# Patient Record
Sex: Female | Born: 1950 | Race: White | Hispanic: No | Marital: Married | Smoking: Never smoker
Health system: Southern US, Community
[De-identification: ages and names within clinical notes are randomized; demographics above are authoritative.]

## PROBLEM LIST (undated history)

## (undated) DIAGNOSIS — R011 Cardiac murmur, unspecified: Secondary | ICD-10-CM

## (undated) DIAGNOSIS — R1310 Dysphagia, unspecified: Secondary | ICD-10-CM

## (undated) DIAGNOSIS — C801 Malignant (primary) neoplasm, unspecified: Secondary | ICD-10-CM

## (undated) DIAGNOSIS — L719 Rosacea, unspecified: Secondary | ICD-10-CM

## (undated) DIAGNOSIS — E119 Type 2 diabetes mellitus without complications: Secondary | ICD-10-CM

## (undated) HISTORY — PX: OTHER SURGICAL HISTORY: SHX169

## (undated) HISTORY — PX: ABDOMINAL HYSTERECTOMY: SHX81

## (undated) HISTORY — PX: NEPHRECTOMY: SHX65

## (undated) HISTORY — PX: LYMPHADENECTOMY: SHX15

---

## 2004-12-17 ENCOUNTER — Ambulatory Visit: Payer: Self-pay | Admitting: Internal Medicine

## 2007-09-08 ENCOUNTER — Ambulatory Visit: Payer: Self-pay | Admitting: Internal Medicine

## 2009-09-21 ENCOUNTER — Ambulatory Visit: Payer: Self-pay | Admitting: Internal Medicine

## 2010-11-20 ENCOUNTER — Ambulatory Visit: Payer: Self-pay | Admitting: Internal Medicine

## 2011-12-19 ENCOUNTER — Ambulatory Visit: Payer: Self-pay | Admitting: Internal Medicine

## 2012-05-01 ENCOUNTER — Ambulatory Visit: Payer: Self-pay | Admitting: Gastroenterology

## 2012-05-04 LAB — PATHOLOGY REPORT

## 2013-02-23 ENCOUNTER — Ambulatory Visit: Payer: Self-pay | Admitting: Internal Medicine

## 2013-06-10 DIAGNOSIS — C801 Malignant (primary) neoplasm, unspecified: Secondary | ICD-10-CM

## 2013-06-10 HISTORY — DX: Malignant (primary) neoplasm, unspecified: C80.1

## 2013-11-10 ENCOUNTER — Inpatient Hospital Stay: Payer: Self-pay | Admitting: Internal Medicine

## 2013-11-10 LAB — URINALYSIS, COMPLETE
BLOOD: NEGATIVE
Bilirubin,UR: NEGATIVE
Glucose,UR: NEGATIVE mg/dL (ref 0–75)
Leukocyte Esterase: NEGATIVE
NITRITE: NEGATIVE
Ph: 8 (ref 4.5–8.0)
Protein: 30
RBC,UR: NONE SEEN /HPF (ref 0–5)
Specific Gravity: 1.024 (ref 1.003–1.030)
WBC UR: NONE SEEN /HPF (ref 0–5)

## 2013-11-10 LAB — COMPREHENSIVE METABOLIC PANEL
ALBUMIN: 4.3 g/dL (ref 3.4–5.0)
Alkaline Phosphatase: 74 U/L
Anion Gap: 6 — ABNORMAL LOW (ref 7–16)
BILIRUBIN TOTAL: 0.5 mg/dL (ref 0.2–1.0)
BUN: 25 mg/dL — ABNORMAL HIGH (ref 7–18)
CREATININE: 0.78 mg/dL (ref 0.60–1.30)
Calcium, Total: 10 mg/dL (ref 8.5–10.1)
Chloride: 103 mmol/L (ref 98–107)
Co2: 26 mmol/L (ref 21–32)
EGFR (African American): >60
GLUCOSE: 154 mg/dL — AB (ref 65–99)
OSMOLALITY: 278 (ref 275–301)
Potassium: 4.3 mmol/L (ref 3.5–5.1)
SGOT(AST): 38 U/L — ABNORMAL HIGH (ref 15–37)
SGPT (ALT): 25 U/L (ref 12–78)
Sodium: 135 mmol/L — ABNORMAL LOW (ref 136–145)
Total Protein: 8.4 g/dL — ABNORMAL HIGH (ref 6.4–8.2)

## 2013-11-10 LAB — CBC WITH DIFFERENTIAL/PLATELET
BASOS ABS: 0.1 10*3/uL (ref 0.0–0.1)
Basophil %: 0.3 %
Eosinophil #: 0 10*3/uL (ref 0.0–0.7)
Eosinophil %: 0.2 %
HCT: 45 % (ref 35.0–47.0)
HGB: 15.1 g/dL (ref 12.0–16.0)
LYMPHS ABS: 1.5 10*3/uL (ref 1.0–3.6)
LYMPHS PCT: 8.3 %
MCH: 29.9 pg (ref 26.0–34.0)
MCHC: 33.6 g/dL (ref 32.0–36.0)
MCV: 89 fL (ref 80–100)
Monocyte #: 1.1 x10 3/mm — ABNORMAL HIGH (ref 0.2–0.9)
Monocyte %: 5.9 %
NEUTROS ABS: 15.5 10*3/uL — AB (ref 1.4–6.5)
Neutrophil %: 85.3 %
Platelet: 452 10*3/uL — ABNORMAL HIGH (ref 150–440)
RBC: 5.06 10*6/uL (ref 3.80–5.20)
RDW: 14.1 % (ref 11.5–14.5)
WBC: 18.1 10*3/uL — AB (ref 3.6–11.0)

## 2013-11-10 LAB — TROPONIN I: Troponin-I: <0.02 ng/mL

## 2013-11-10 LAB — LIPASE, BLOOD: LIPASE: 79 U/L (ref 73–393)

## 2013-11-11 LAB — CBC WITH DIFFERENTIAL/PLATELET
BASOS PCT: 0.4 %
Basophil #: 0 10*3/uL (ref 0.0–0.1)
EOS PCT: 0.7 %
Eosinophil #: 0.1 10*3/uL (ref 0.0–0.7)
HCT: 40.3 % (ref 35.0–47.0)
HGB: 13.5 g/dL (ref 12.0–16.0)
Lymphocyte #: 1.6 10*3/uL (ref 1.0–3.6)
Lymphocyte %: 18 %
MCH: 29.9 pg (ref 26.0–34.0)
MCHC: 33.6 g/dL (ref 32.0–36.0)
MCV: 89 fL (ref 80–100)
MONOS PCT: 12.1 %
Monocyte #: 1.1 x10 3/mm — ABNORMAL HIGH (ref 0.2–0.9)
NEUTROS ABS: 6 10*3/uL (ref 1.4–6.5)
Neutrophil %: 68.8 %
Platelet: 389 10*3/uL (ref 150–440)
RBC: 4.52 10*6/uL (ref 3.80–5.20)
RDW: 14 % (ref 11.5–14.5)
WBC: 8.7 10*3/uL (ref 3.6–11.0)

## 2013-11-11 LAB — COMPREHENSIVE METABOLIC PANEL
ALBUMIN: 3.3 g/dL — AB (ref 3.4–5.0)
ALK PHOS: 60 U/L
Anion Gap: 4 — ABNORMAL LOW (ref 7–16)
BILIRUBIN TOTAL: 0.6 mg/dL (ref 0.2–1.0)
BUN: 24 mg/dL — AB (ref 7–18)
CHLORIDE: 105 mmol/L (ref 98–107)
CREATININE: 0.73 mg/dL (ref 0.60–1.30)
Calcium, Total: 8.4 mg/dL — ABNORMAL LOW (ref 8.5–10.1)
Co2: 28 mmol/L (ref 21–32)
Glucose: 119 mg/dL — ABNORMAL HIGH (ref 65–99)
OSMOLALITY: 279 (ref 275–301)
POTASSIUM: 4.3 mmol/L (ref 3.5–5.1)
SGOT(AST): 26 U/L (ref 15–37)
SGPT (ALT): 18 U/L (ref 12–78)
Sodium: 137 mmol/L (ref 136–145)
Total Protein: 6.6 g/dL (ref 6.4–8.2)

## 2014-04-06 ENCOUNTER — Ambulatory Visit: Payer: Self-pay | Admitting: Internal Medicine

## 2014-10-01 NOTE — Discharge Summary (Signed)
PATIENT NAME:  Sandra Gordon, Sandra Gordon MR#:  [id number] DATE OF BIRTH:  [birth year]  DATE OF ADMISSION:  11/10/2013 DATE OF DISCHARGE:  11/13/2013  DISCHARGE DIAGNOSES:   1. Small bowel obstruction/ileus.  2. Right adnexal/ovarian cystic mass, etiology unknown.  Found on CT.  3. 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  4. Right kidney mass.  5. Bladder issues.  6. Arthritis.   DISCHARGE MEDICATIONS: As follows: Tums Ultra 1000  p.o. b.i.d. with meals, citalopram 20 mg p.o. daily, meloxicam 15 mg p.o. daily, spironolactone 50 mg p.o. b.i.d., temazepam 15 p.o. at bedtime, tolterodine 2 mg b.i.d., doxycycline 100 p.o. daily.   INDICATION FOR ADMISSION:  The patient is a pleasant 64 year old female who presented with epigastric pain and nausea. CT scan showed among other findings, abdominal fullness and epigastric pain.  She had a CT scan, which was concerning for ileus or small bowel obstruction. She was admitted for management of possible small bowel obstruction.   HOSPITAL COURSE: As follows:  This patient was admitted for an acute epigastric pain and CT scan concerning for a small bowel obstruction; however, had no history of surgeries but with the acuity was not concerned of the obstructing mass. Potentially she did have some sort of other  issue which could be causing a blockage and, therefore, we did decide to treat her with an NG decompression.  Throughout hospital course, her x-ray looked better.  Her contrast which she had drunk had progressed. She began having bowel movements and passing flatus and NG output became minimal. NG tube was taken out and then she was converted from clear liquids to a regular diet. At the time of discharge was having no pain and was tolerating a diet with bowel movements.   DISCHARGE INSTRUCTIONS: This patient is to follow up with me in approximately 1 week. I will set her up with Dr. Elnoria Howard of nephrology as well as her OB/GYN to evaluate the other incidental findings on her CT  scan. There are no acute surgical issues.     ____________________________ Glena Norfolk Luster Hechler, MD cal:dd/am D: 11/25/2013 19:25:38 ET T: 11/26/2013 04:12:55 ET JOB#: [id number]  cc: Harrell Gave A. Oaklyn Jakubek, MD, <Dictator> Floyde Parkins MD ELECTRONICALLY SIGNED 11/30/2013 10:47

## 2014-10-01 NOTE — H&P (Signed)
   Subjective/Chief Complaint Epigastric pain, anorexia, feels full   History of Present Illness Sandra Gordon is a relatively healthy 64 yo F who presents with 1 day of worsening, constant epigastric pain.  Began early this am.  Took ibuprofen initially but did not get better.  Initially intermittent, now constant.  + chills with pain.  no n/v but was not able to drink more than 1 bottle of contrast because feeling full.  Last BM today, somewhat loose.  No sick contacts, no unusual ingestions, no foreign travel.   Past History Bladder issues Arthritis   Past Medical Health None   Past Med/Surgical Hx:  Denies medical history:   ALLERGIES:  Codeine: Unknown  Sulfa: Unknown  Family and Social History:  Family History Coronary Artery Disease  Hypertension  Diabetes Mellitus   Social History negative tobacco, negative ETOH, negative Illicit drugs   Place of Living Home  Here with husband   Review of Systems:  Subjective/Chief Complaint Epigastric pain, constant, anorexia, feeling full   Fever/Chills Yes   Cough No   Sputum No   Abdominal Pain Yes   Diarrhea No   Constipation No   Nausea/Vomiting No   SOB/DOE No   Chest Pain No   Dysuria No   Tolerating PT No   Tolerating Diet No   Physical Exam:  GEN well developed, well nourished, no acute distress   HEENT pink conjunctivae, PERRL, hearing intact to voice, good dentition   RESP normal resp effort  clear BS  no use of accessory muscles   CARD regular rate  no murmur   ABD denies tenderness  no hernia  soft  distended  normal BS   EXTR negative cyanosis/clubbing, negative edema   SKIN normal to palpation, No rashes, No ulcers, skin turgor good   NEURO cranial nerves intact, negative rigidity, negative tremor, follows commands, strength:, motor/sensory function intact   PSYCH alert, A+O to time, place, person, good insight    Assessment/Admission Diagnosis Sandra Gordon is a pleasant 64 yo F who presents  with 1 day of epigastric pain, leukocytosis and CT findings consistent with ? SBO.  No history of abdominal surgery.  LFT/Lipase NL.   Plan Admit for IVF, NPO, NG tube.  Am KUB to look for contrast progression.  Serial abd exams.   Electronic Signatures: Floyde Parkins (MD)  (Signed 03-Jun-15 16:03)  Authored: CHIEF COMPLAINT and HISTORY, PAST MEDICAL/SURGIAL HISTORY, ALLERGIES, FAMILY AND SOCIAL HISTORY, REVIEW OF SYSTEMS, PHYSICAL EXAM, ASSESSMENT AND PLAN   Last Updated: 03-Jun-15 16:03 by Floyde Parkins (MD)

## 2014-10-13 ENCOUNTER — Other Ambulatory Visit: Payer: Self-pay | Admitting: Gastroenterology

## 2014-10-13 DIAGNOSIS — R1013 Epigastric pain: Secondary | ICD-10-CM

## 2014-10-13 DIAGNOSIS — R1319 Other dysphagia: Secondary | ICD-10-CM

## 2014-10-13 DIAGNOSIS — R1310 Dysphagia, unspecified: Secondary | ICD-10-CM

## 2014-10-17 ENCOUNTER — Ambulatory Visit
Admission: RE | Admit: 2014-10-17 | Discharge: 2014-10-17 | Disposition: A | Discharge status: Home / Self Care | Payer: BLUE CROSS/BLUE SHIELD | Source: Ambulatory Visit | Attending: Gastroenterology | Admitting: Gastroenterology

## 2014-10-17 DIAGNOSIS — K219 Gastro-esophageal reflux disease without esophagitis: Secondary | ICD-10-CM | POA: Diagnosis not present

## 2014-10-17 DIAGNOSIS — R1013 Epigastric pain: Secondary | ICD-10-CM

## 2014-10-17 DIAGNOSIS — R1319 Other dysphagia: Secondary | ICD-10-CM

## 2014-10-17 DIAGNOSIS — R1310 Dysphagia, unspecified: Secondary | ICD-10-CM

## 2014-10-17 HISTORY — DX: Malignant (primary) neoplasm, unspecified: C80.1

## 2014-11-10 ENCOUNTER — Encounter: Payer: Self-pay | Admitting: *Deleted

## 2014-11-11 ENCOUNTER — Ambulatory Visit: Payer: BLUE CROSS/BLUE SHIELD | Admitting: Anesthesiology

## 2014-11-11 ENCOUNTER — Ambulatory Visit
Admission: RE | Admit: 2014-11-11 | Discharge: 2014-11-11 | Disposition: A | Discharge status: Home / Self Care | Payer: BLUE CROSS/BLUE SHIELD | Source: Ambulatory Visit | Attending: Gastroenterology | Admitting: Gastroenterology

## 2014-11-11 ENCOUNTER — Encounter
Admission: RE | Disposition: A | Discharge status: Home / Self Care | Payer: Self-pay | Source: Ambulatory Visit | Attending: Gastroenterology

## 2014-11-11 DIAGNOSIS — Z885 Allergy status to narcotic agent status: Secondary | ICD-10-CM | POA: Insufficient documentation

## 2014-11-11 DIAGNOSIS — K222 Esophageal obstruction: Secondary | ICD-10-CM | POA: Insufficient documentation

## 2014-11-11 DIAGNOSIS — R1013 Epigastric pain: Secondary | ICD-10-CM | POA: Diagnosis present

## 2014-11-11 DIAGNOSIS — K224 Dyskinesia of esophagus: Secondary | ICD-10-CM | POA: Insufficient documentation

## 2014-11-11 DIAGNOSIS — Z882 Allergy status to sulfonamides status: Secondary | ICD-10-CM | POA: Diagnosis not present

## 2014-11-11 DIAGNOSIS — L719 Rosacea, unspecified: Secondary | ICD-10-CM | POA: Insufficient documentation

## 2014-11-11 DIAGNOSIS — K2950 Unspecified chronic gastritis without bleeding: Secondary | ICD-10-CM | POA: Insufficient documentation

## 2014-11-11 DIAGNOSIS — K210 Gastro-esophageal reflux disease with esophagitis: Secondary | ICD-10-CM | POA: Diagnosis not present

## 2014-11-11 DIAGNOSIS — Z79899 Other long term (current) drug therapy: Secondary | ICD-10-CM | POA: Diagnosis not present

## 2014-11-11 DIAGNOSIS — R1310 Dysphagia, unspecified: Secondary | ICD-10-CM | POA: Insufficient documentation

## 2014-11-11 HISTORY — DX: Dysphagia, unspecified: R13.10

## 2014-11-11 HISTORY — DX: Rosacea, unspecified: L71.9

## 2014-11-11 HISTORY — PX: ESOPHAGOGASTRODUODENOSCOPY (EGD) WITH PROPOFOL: SHX5813

## 2014-11-11 SURGERY — ESOPHAGOGASTRODUODENOSCOPY (EGD) WITH PROPOFOL
Anesthesia: General

## 2014-11-11 MED ORDER — SODIUM CHLORIDE 0.9 % IV SOLN
INTRAVENOUS | Status: DC
  Administered 2014-11-11: 1000 mL via INTRAVENOUS

## 2014-11-11 MED ORDER — PROPOFOL INFUSION 10 MG/ML OPTIME
INTRAVENOUS | Status: DC | PRN
  Administered 2014-11-11: 140 ug/kg/min via INTRAVENOUS

## 2014-11-11 MED ORDER — FENTANYL CITRATE (PF) 100 MCG/2ML IJ SOLN
INTRAMUSCULAR | Status: DC | PRN
  Administered 2014-11-11: 50 ug via INTRAVENOUS

## 2014-11-11 MED ORDER — SODIUM CHLORIDE 0.9 % IV SOLN: INTRAVENOUS | Status: DC

## 2014-11-11 MED ORDER — MIDAZOLAM HCL 2 MG/2ML IJ SOLN
INTRAMUSCULAR | Status: DC | PRN
  Administered 2014-11-11: 1 mg via INTRAVENOUS

## 2014-11-11 NOTE — Anesthesia Postprocedure Evaluation (Signed)
  Anesthesia Post-op Note  Patient: Sandra Gordon  Procedure(s) Performed: Procedure(s): ESOPHAGOGASTRODUODENOSCOPY (EGD) WITH PROPOFOL (N/A)  Anesthesia type:General  Patient location: PACU  Post pain: Pain level controlled  Post assessment: Post-op Vital signs reviewed, Patient's Cardiovascular Status Stable, Respiratory Function Stable, Patent Airway and No signs of Nausea or vomiting  Post vital signs: Reviewed and stable  Last Vitals:  Filed Vitals:   11/11/14 1606  BP: 140/69  Pulse: 60  Temp:   Resp: 19    Level of consciousness: awake, alert  and patient cooperative  Complications: No apparent anesthesia complications

## 2014-11-11 NOTE — Op Note (Signed)
[facility] Gastroenterology Patient Name: Sandra Gordon Procedure Date: 11/11/2014 2:59 PM MRN: [id number] Account #: [id number] Date of Birth: June 03, 1951 Admit Type: Outpatient Age: 64 Room: [facility] ENDO ROOM 3 Gender: Female Note Status: Finalized Procedure:         Upper GI endoscopy Indications:       Dyspepsia, Dysphagia Providers:         Lollie Sails, MD Referring MD:      Hewitt Blade. Sarina Ser, MD (Referring MD) Complications:     No immediate complications. Procedure:         Pre-Anesthesia Assessment:                    - ASA Grade Assessment: II - A patient with mild systemic                     disease.                    After obtaining informed consent, the endoscope was passed                     under direct vision. Throughout the procedure, the                     patient's blood pressure, pulse, and oxygen saturations                     were monitored continuously. The Endoscope was introduced                     through the mouth, and advanced to the third part of                     duodenum. The upper GI endoscopy was accomplished without                     difficulty. The patient tolerated the procedure well. Findings:      LA Grade B (one or more mucosal breaks greater than 5 mm, not extending       between the tops of two mucosal folds) esophagitis with no bleeding was       found. Biopsies were taken with a cold forceps for histology.      A widely patent and non-obstructing Schatzki ring (acquired) was found       at the gastroesophageal junction. This appeared intermittantly. The       biopsies were taken across the apparent ring.      Abnormal motility was noted in the lower third of the esophagus. The       cricopharyngeus was normal. There is spasticity of the esophageal body.      Patchy minimal inflammation characterized by erythema was found in the       gastric body. Biopsies were taken with a cold forceps for histology.   Biopsies were taken with a cold forceps for Helicobacter pylori testing.      Patchy mild inflammation characterized by congestion (edema) and       erosions was found in the gastric antrum. Biopsies were taken with a       cold forceps for histology. Biopsies were taken with a cold forceps for       Helicobacter pylori testing. Impression:        - LA Grade B esophagitis. Biopsied.                    -  Widely patent and non-obstructing Schatzki ring.                    - Esophageal motility disorder.                    - Gastritis. Biopsied.                    - Erosive gastritis. Biopsied. Recommendation:    - Await pathology results.                    - Use Protonix (pantoprazole) 40 mg PO BID for 4 weeks.                    - Use Protonix (pantoprazole) 40 mg PO daily daily. Procedure Code(s): --- Professional ---                    [phone], Esophagogastroduodenoscopy, flexible, transoral;                     with biopsy, single or multiple Diagnosis Code(s): --- Professional ---                    530.10, Esophagitis, unspecified                    530.3, Stricture and stenosis of esophagus                    530.5, Dyskinesia of esophagus                    535.50, Unspecified gastritis and gastroduodenitis,                     without mention of hemorrhage                    535.40, Other specified gastritis, without mention of                     hemorrhage                    536.8, Dyspepsia and other specified disorders of function                     of stomach                    787.20, Dysphagia, unspecified CPT copyright 2014 American Medical Association. All rights reserved. The codes documented in this report are preliminary and upon coder review may  be revised to meet current compliance requirements. Lollie Sails, MD 11/11/2014 3:38:42 PM This report has been signed electronically. Number of Addenda: 0 Note Initiated On: 11/11/2014 2:59 PM      [facility]

## 2014-11-11 NOTE — H&P (Signed)
Outpatient short stay form Pre-procedure 11/11/2014 2:22 PM Sandra Sails MD  Primary Physician: Dr. Lisette Grinder  Reason for visit:  Dysphagia/dyspepsia  History of present illness:  Patient is a 64 year old female who is presenting with complaint of some amount of dysphagia. When she states she gets something stuck in the lower chest she is still able to pass water and does not regurgitate food. A barium swallow was done with tablet showing no evidence of stricture or ring. He had been started on a low-dose of omeprazole or has been taking about 2-1/2 hours before meal may be compromising its efficacious.    Current facility-administered medications:  .  0.9 %  sodium chloride infusion, , Intravenous, Continuous, Sandra Sails, MD, Last Rate: 50 mL/hr at 11/11/14 1335, 1,000 mL at 11/11/14 1335 .  0.9 %  sodium chloride infusion, , Intravenous, Continuous, Sandra Sails, MD  Prescriptions prior to admission  Medication Sig Dispense Refill Last Dose  . calcium carbonate (OS-CAL) 600 MG TABS tablet Take 1 tablet by mouth daily.   Past Week  . citalopram (CELEXA) 20 MG tablet Take 1 tablet by mouth daily.   11/10/2014 at 900  . doxycycline (VIBRAMYCIN) 100 MG capsule Take 1 capsule by mouth daily.   11/10/2014 at 900  . meloxicam (MOBIC) 15 MG tablet Take 1 tablet by mouth daily.   Past Week at Unknown time  . Omeprazole 20 MG TBEC Take 1 tablet by mouth daily.   11/10/2014 at 900  . spironolactone (ALDACTONE) 50 MG tablet Take 1 tablet by mouth 2 (two) times daily.   11/10/2014 at 900pm  . temazepam (RESTORIL) 15 MG capsule Take 1 capsule by mouth at bedtime.   11/10/2014 at 900pm  . tolterodine (DETROL) 2 MG tablet Take 1 tablet by mouth 2 (two) times daily.   11/10/2014 at 900pm     Allergies  Allergen Reactions  . Codeine Palpitations  . Sulfa Antibiotics Rash     Past Medical History  Diagnosis Date  . Cancer   . Dysphagia   . Rosacea     Review of systems:       Physical Exam    Heart and lungs: Regular rate and rhythm without rub or gallop lungs are bilaterally clear    HEENT: Normocephalic atraumatic eyes are anicteric    Other:     Pertinant exam for procedure: Soft nontender nondistended bowel sounds positive normoactive    Planned proceedures: EGD with indicated procedures. I have discussed the risks benefits and complications of procedures to include not limited to bleeding, infection, perforation and the risk of sedation and the patient wishes to proceed.    Sandra Sails, MD Gastroenterology 11/11/2014  2:22 PM

## 2014-11-11 NOTE — Transfer of Care (Signed)
Immediate Anesthesia Transfer of Care Note  Patient: Sandra Gordon  Procedure(s) Performed: Procedure(s): ESOPHAGOGASTRODUODENOSCOPY (EGD) WITH PROPOFOL (N/A)  Patient Location: PACU and Endoscopy Unit  Anesthesia Type:General  Level of Consciousness: awake, alert  and oriented  Airway & Oxygen Therapy: Patient Spontanous Breathing and Patient connected to nasal cannula oxygen  Post-op Assessment: Report given to RN and Post -op Vital signs reviewed and stable  Post vital signs: Reviewed and stable  Last Vitals:  Filed Vitals:   11/11/14 1534  BP:   Pulse: 71  Temp: 36.8 C  Resp:     Complications: No apparent anesthesia complications

## 2014-11-11 NOTE — Anesthesia Preprocedure Evaluation (Addendum)
Anesthesia Evaluation  Patient identified by MRN, date of birth, ID band Patient awake    Reviewed: Allergy & Precautions, NPO status   History of Anesthesia Complications Negative for: history of anesthetic complications  Airway Mallampati: III       Dental no notable dental hx.    Pulmonary neg pulmonary ROS,    Pulmonary exam normal       Cardiovascular Exercise Tolerance: Good negative cardio ROS Normal cardiovascular exam    Neuro/Psych negative neurological ROS  negative psych ROS   GI/Hepatic negative GI ROS,   Endo/Other  negative endocrine ROS  Renal/GU Renal diseasenegative Renal ROS  negative genitourinary   Musculoskeletal negative musculoskeletal ROS (+)   Abdominal   Peds negative pediatric ROS (+)  Hematology negative hematology ROS (+)   Anesthesia Other Findings   Reproductive/Obstetrics negative OB ROS                             Anesthesia Physical Anesthesia Plan  ASA: II  Anesthesia Plan: General   Post-op Pain Management:    Induction: Intravenous  Airway Management Planned: Nasal Cannula  Additional Equipment:   Intra-op Plan:   Post-operative Plan:   Informed Consent: I have reviewed the patients History and Physical, chart, labs and discussed the procedure including the risks, benefits and alternatives for the proposed anesthesia with the patient or authorized representative who has indicated his/her understanding and acceptance.     Plan Discussed with: Anesthesiologist  Anesthesia Plan Comments:        Anesthesia Quick Evaluation

## 2014-11-15 LAB — SURGICAL PATHOLOGY

## 2014-11-16 ENCOUNTER — Encounter: Payer: Self-pay | Admitting: Gastroenterology

## 2015-03-13 ENCOUNTER — Other Ambulatory Visit: Payer: Self-pay | Admitting: Internal Medicine

## 2015-03-13 DIAGNOSIS — Z1231 Encounter for screening mammogram for malignant neoplasm of breast: Secondary | ICD-10-CM

## 2015-04-10 ENCOUNTER — Ambulatory Visit
Admission: RE | Admit: 2015-04-10 | Discharge: 2015-04-10 | Disposition: A | Discharge status: Home / Self Care | Payer: BLUE CROSS/BLUE SHIELD | Source: Ambulatory Visit | Attending: Internal Medicine | Admitting: Internal Medicine

## 2015-04-10 DIAGNOSIS — Z1231 Encounter for screening mammogram for malignant neoplasm of breast: Secondary | ICD-10-CM

## 2016-03-18 ENCOUNTER — Other Ambulatory Visit: Payer: Self-pay | Admitting: Internal Medicine

## 2016-03-18 DIAGNOSIS — Z1231 Encounter for screening mammogram for malignant neoplasm of breast: Secondary | ICD-10-CM

## 2016-04-12 ENCOUNTER — Ambulatory Visit
Admission: RE | Admit: 2016-04-12 | Discharge: 2016-04-12 | Disposition: A | Discharge status: Home / Self Care | Payer: BLUE CROSS/BLUE SHIELD | Source: Ambulatory Visit | Attending: Internal Medicine | Admitting: Internal Medicine

## 2016-04-12 DIAGNOSIS — Z1231 Encounter for screening mammogram for malignant neoplasm of breast: Secondary | ICD-10-CM | POA: Diagnosis not present

## 2017-01-16 ENCOUNTER — Other Ambulatory Visit: Payer: Self-pay | Admitting: Otolaryngology

## 2017-01-16 DIAGNOSIS — J3800 Paralysis of vocal cords and larynx, unspecified: Secondary | ICD-10-CM

## 2017-01-21 ENCOUNTER — Ambulatory Visit
Admission: RE | Admit: 2017-01-21 | Discharge: 2017-01-21 | Disposition: A | Discharge status: Home / Self Care | Payer: Medicare Other | Source: Ambulatory Visit | Attending: Otolaryngology | Admitting: Otolaryngology

## 2017-01-21 DIAGNOSIS — K760 Fatty (change of) liver, not elsewhere classified: Secondary | ICD-10-CM | POA: Insufficient documentation

## 2017-01-21 DIAGNOSIS — D1809 Hemangioma of other sites: Secondary | ICD-10-CM | POA: Diagnosis not present

## 2017-01-21 DIAGNOSIS — J3800 Paralysis of vocal cords and larynx, unspecified: Secondary | ICD-10-CM

## 2017-01-21 DIAGNOSIS — J8410 Pulmonary fibrosis, unspecified: Secondary | ICD-10-CM | POA: Diagnosis not present

## 2017-01-21 HISTORY — DX: Type 2 diabetes mellitus without complications: E11.9

## 2017-01-21 MED ORDER — IOPAMIDOL (ISOVUE-300) INJECTION 61%
75.0000 mL | Freq: Once | INTRAVENOUS | Status: AC | PRN
  Administered 2017-01-21: 75 mL via INTRAVENOUS

## 2017-03-26 ENCOUNTER — Other Ambulatory Visit: Payer: Self-pay | Admitting: Internal Medicine

## 2017-03-26 DIAGNOSIS — Z1231 Encounter for screening mammogram for malignant neoplasm of breast: Secondary | ICD-10-CM

## 2017-04-15 ENCOUNTER — Ambulatory Visit
Admission: RE | Admit: 2017-04-15 | Discharge: 2017-04-15 | Disposition: A | Discharge status: Home / Self Care | Payer: Medicare Other | Source: Ambulatory Visit | Attending: Internal Medicine | Admitting: Internal Medicine

## 2017-04-15 DIAGNOSIS — Z1231 Encounter for screening mammogram for malignant neoplasm of breast: Secondary | ICD-10-CM | POA: Diagnosis not present

## 2017-04-18 ENCOUNTER — Other Ambulatory Visit: Payer: Self-pay | Admitting: Internal Medicine

## 2017-04-18 DIAGNOSIS — R928 Other abnormal and inconclusive findings on diagnostic imaging of breast: Secondary | ICD-10-CM

## 2017-04-18 DIAGNOSIS — N6489 Other specified disorders of breast: Secondary | ICD-10-CM

## 2017-04-24 ENCOUNTER — Ambulatory Visit
Admission: RE | Admit: 2017-04-24 | Discharge: 2017-04-24 | Disposition: A | Discharge status: Home / Self Care | Payer: Medicare Other | Source: Ambulatory Visit | Attending: Internal Medicine | Admitting: Internal Medicine

## 2017-04-24 DIAGNOSIS — N6489 Other specified disorders of breast: Secondary | ICD-10-CM

## 2017-04-24 DIAGNOSIS — R928 Other abnormal and inconclusive findings on diagnostic imaging of breast: Secondary | ICD-10-CM

## 2018-04-30 ENCOUNTER — Other Ambulatory Visit: Payer: Self-pay | Admitting: Internal Medicine

## 2018-04-30 DIAGNOSIS — Z1231 Encounter for screening mammogram for malignant neoplasm of breast: Secondary | ICD-10-CM

## 2018-05-11 ENCOUNTER — Ambulatory Visit
Admission: RE | Admit: 2018-05-11 | Discharge: 2018-05-11 | Disposition: A | Discharge status: Home / Self Care | Payer: Medicare Other | Source: Ambulatory Visit | Attending: Internal Medicine | Admitting: Internal Medicine

## 2018-05-11 DIAGNOSIS — Z1231 Encounter for screening mammogram for malignant neoplasm of breast: Secondary | ICD-10-CM | POA: Diagnosis not present

## 2019-06-16 ENCOUNTER — Other Ambulatory Visit: Payer: Self-pay | Admitting: Internal Medicine

## 2019-06-16 DIAGNOSIS — Z1231 Encounter for screening mammogram for malignant neoplasm of breast: Secondary | ICD-10-CM

## 2019-07-14 ENCOUNTER — Ambulatory Visit
Admission: RE | Admit: 2019-07-14 | Discharge: 2019-07-14 | Disposition: A | Discharge status: Home / Self Care | Payer: Medicare Other | Source: Ambulatory Visit | Attending: Internal Medicine | Admitting: Internal Medicine

## 2019-07-14 DIAGNOSIS — Z1231 Encounter for screening mammogram for malignant neoplasm of breast: Secondary | ICD-10-CM | POA: Diagnosis present

## 2019-07-22 ENCOUNTER — Ambulatory Visit: Payer: Medicare Other | Attending: Internal Medicine

## 2019-07-22 DIAGNOSIS — Z23 Encounter for immunization: Secondary | ICD-10-CM | POA: Insufficient documentation

## 2019-07-22 NOTE — Progress Notes (Signed)
   Covid-19 Vaccination Clinic  Name:  Sandra Gordon    MRN: [id number] DOB: [birth year]  07/22/2019  Sandra Gordon was observed post Covid-19 immunization for 15 minutes without incidence. She was provided with Vaccine Information Sheet and instruction to access the V-Safe system.   Sandra Gordon was instructed to call 911 with any severe reactions post vaccine: Marland Kitchen Difficulty breathing  . Swelling of your face and throat  . A fast heartbeat  . A bad rash all over your body  . Dizziness and weakness    Immunizations Administered    Name Date Dose VIS Date Route   Pfizer COVID-19 Vaccine 07/22/2019  8:37 AM 0.3 mL 05/21/2019 Intramuscular   Manufacturer: [place]   Lot: [id number]   [place]: [id number]

## 2019-08-17 ENCOUNTER — Ambulatory Visit: Payer: Medicare Other | Attending: Internal Medicine

## 2019-08-17 DIAGNOSIS — Z23 Encounter for immunization: Secondary | ICD-10-CM | POA: Insufficient documentation

## 2019-08-17 NOTE — Progress Notes (Signed)
   Covid-19 Vaccination Clinic  Name:  Sandra Gordon    MRN: [id number] DOB: 25-Jun-1950  08/17/2019  Sandra Gordon was observed post Covid-19 immunization for 15 minutes without incident. She was provided with Vaccine Information Sheet and instruction to access the V-Safe system.   Sandra Gordon was instructed to call 911 with any severe reactions post vaccine: Marland Kitchen Difficulty breathing  . Swelling of face and throat  . A fast heartbeat  . A bad rash all over body  . Dizziness and weakness   Immunizations Administered    Name Date Dose VIS Date Route   Pfizer COVID-19 Vaccine 08/17/2019  1:59 PM 0.3 mL 05/21/2019 Intramuscular   Manufacturer: [place]   Lot: [id number]   [place]: [id number]

## 2020-03-01 ENCOUNTER — Encounter: Payer: Self-pay | Admitting: Oncology

## 2020-03-01 ENCOUNTER — Other Ambulatory Visit: Payer: Self-pay | Admitting: Oncology

## 2020-03-01 ENCOUNTER — Ambulatory Visit (HOSPITAL_COMMUNITY)
Admission: RE | Admit: 2020-03-01 | Discharge: 2020-03-01 | Disposition: A | Discharge status: Home / Self Care | Payer: Medicare Other | Source: Ambulatory Visit | Attending: Pulmonary Disease | Admitting: Pulmonary Disease

## 2020-03-01 DIAGNOSIS — U071 COVID-19: Secondary | ICD-10-CM

## 2020-03-01 DIAGNOSIS — Z23 Encounter for immunization: Secondary | ICD-10-CM | POA: Diagnosis not present

## 2020-03-01 MED ORDER — DIPHENHYDRAMINE HCL 50 MG/ML IJ SOLN: 50.0000 mg | Freq: Once | INTRAMUSCULAR | Status: DC | PRN

## 2020-03-01 MED ORDER — SODIUM CHLORIDE 0.9 % IV SOLN
1200.0000 mg | Freq: Once | INTRAVENOUS | Status: AC
  Administered 2020-03-01: 17:00:00 1200 mg via INTRAVENOUS

## 2020-03-01 MED ORDER — ALBUTEROL SULFATE HFA 108 (90 BASE) MCG/ACT IN AERS: 2.0000 | INHALATION_SPRAY | Freq: Once | RESPIRATORY_TRACT | Status: DC | PRN

## 2020-03-01 MED ORDER — FAMOTIDINE IN NACL 20-0.9 MG/50ML-% IV SOLN: 20.0000 mg | Freq: Once | INTRAVENOUS | Status: DC | PRN

## 2020-03-01 MED ORDER — SODIUM CHLORIDE 0.9 % IV SOLN: INTRAVENOUS | Status: DC | PRN

## 2020-03-01 MED ORDER — METHYLPREDNISOLONE SODIUM SUCC 125 MG IJ SOLR: 125.0000 mg | Freq: Once | INTRAMUSCULAR | Status: DC | PRN

## 2020-03-01 MED ORDER — EPINEPHRINE 0.3 MG/0.3ML IJ SOAJ: 0.3000 mg | Freq: Once | INTRAMUSCULAR | Status: DC | PRN

## 2020-03-01 NOTE — Progress Notes (Signed)
  Diagnosis: COVID-19  Physician: Wright, MD  Procedure: Covid Infusion Clinic Med: casirivimab\imdevimab infusion - Provided patient with casirivimab\imdevimab fact sheet for patients, parents and caregivers prior to infusion.  Complications: No immediate complications noted.  Discharge: Discharged home   Sandra Gordon 03/01/2020   

## 2020-03-01 NOTE — Progress Notes (Signed)
I connected by phone with  Mrs. Laurich to discuss the potential use of an new treatment for mild to moderate COVID-19 viral infection in non-hospitalized patients.   This patient is a age/sex that meets the FDA criteria for Emergency Use Authorization of casirivimab\imdevimab.  Has a (+) direct SARS-CoV-2 viral test result 1. Has mild or moderate COVID-19  2. Is ? 68 years of age and weighs ? 40 kg 3. Is NOT hospitalized due to COVID-19 4. Is NOT requiring oxygen therapy or requiring an increase in baseline oxygen flow rate due to COVID-19 5. Is within 10 days of symptom onset 6. Has at least one of the high risk factor(s) for progression to severe COVID-19 and/or hospitalization as defined in EUA. Specific high risk criteria : Past Medical History:  Diagnosis Date  . Cancer ([place]) 2015   kidney  . Diabetes mellitus without complication ([place])   . Dysphagia   . Rosacea   ?  ?    Symptom onset  02/27/2020.   I have spoken and communicated the following to the patient or parent/caregiver:   1. FDA has authorized the emergency use of casirivimab\imdevimab for the treatment of mild to moderate COVID-19 in adults and pediatric patients with positive results of direct SARS-CoV-2 viral testing who are 26 years of age and older weighing at least 40 kg, and who are at high risk for progressing to severe COVID-19 and/or hospitalization.   2. The significant known and potential risks and benefits of casirivimab\imdevimab, and the extent to which such potential risks and benefits are unknown.   3. Information on available alternative treatments and the risks and benefits of those alternatives, including clinical trials.   4. Patients treated with casirivimab\imdevimab should continue to self-isolate and use infection control measures (e.g., wear mask, isolate, social distance, avoid sharing personal items, clean and disinfect "high touch" surfaces, and frequent handwashing) according to CDC  guidelines.    5. The patient or parent/caregiver has the option to accept or refuse casirivimab\imdevimab .   After reviewing this information with the patient, The patient agreed to proceed with receiving casirivimab\imdevimab infusion and will be provided a copy of the Fact sheet prior to receiving the infusion.Rulon Abide, AGNP-C [phone] ([place])

## 2020-03-01 NOTE — Discharge Instructions (Signed)

## 2020-06-12 ENCOUNTER — Ambulatory Visit: Payer: Medicare Other | Attending: Internal Medicine

## 2020-06-12 DIAGNOSIS — Z23 Encounter for immunization: Secondary | ICD-10-CM

## 2020-06-12 NOTE — Progress Notes (Signed)
   Covid-19 Vaccination Clinic  Name:  Sandra Gordon    MRN: [id number] DOB: Sep 30, 1950  06/12/2020  Sandra Gordon was observed post Covid-19 immunization for 15 minutes without incident. She was provided with Vaccine Information Sheet and instruction to access the V-Safe system.   Sandra Gordon was instructed to call 911 with any severe reactions post vaccine: Marland Kitchen Difficulty breathing  . Swelling of face and throat  . A fast heartbeat  . A bad rash all over body  . Dizziness and weakness   Immunizations Administered    Name Date Dose VIS Date Route   Pfizer COVID-19 Vaccine 06/12/2020  3:08 PM 0.3 mL 03/29/2020 Intramuscular   Manufacturer: [employer], [employer]   Lot: [id number]   [place]: [address]-8

## 2020-06-13 ENCOUNTER — Other Ambulatory Visit: Payer: Self-pay | Admitting: Internal Medicine

## 2020-06-13 DIAGNOSIS — Z85528 Personal history of other malignant neoplasm of kidney: Secondary | ICD-10-CM

## 2020-06-13 DIAGNOSIS — Z0000 Encounter for general adult medical examination without abnormal findings: Secondary | ICD-10-CM

## 2020-06-21 ENCOUNTER — Ambulatory Visit
Admission: RE | Admit: 2020-06-21 | Discharge: 2020-06-21 | Disposition: A | Discharge status: Home / Self Care | Payer: Medicare Other | Source: Ambulatory Visit | Attending: Internal Medicine | Admitting: Internal Medicine

## 2020-06-21 ENCOUNTER — Other Ambulatory Visit: Payer: Self-pay

## 2020-06-21 DIAGNOSIS — Z85528 Personal history of other malignant neoplasm of kidney: Secondary | ICD-10-CM | POA: Diagnosis present

## 2020-06-21 DIAGNOSIS — Z0000 Encounter for general adult medical examination without abnormal findings: Secondary | ICD-10-CM | POA: Insufficient documentation

## 2020-08-31 ENCOUNTER — Other Ambulatory Visit: Payer: Self-pay | Admitting: Internal Medicine

## 2020-08-31 DIAGNOSIS — Z1231 Encounter for screening mammogram for malignant neoplasm of breast: Secondary | ICD-10-CM

## 2020-09-19 ENCOUNTER — Other Ambulatory Visit: Payer: Self-pay

## 2020-09-19 ENCOUNTER — Ambulatory Visit
Admission: RE | Admit: 2020-09-19 | Discharge: 2020-09-19 | Disposition: A | Discharge status: Home / Self Care | Payer: Medicare Other | Source: Ambulatory Visit | Attending: Internal Medicine | Admitting: Internal Medicine

## 2020-09-19 DIAGNOSIS — Z1231 Encounter for screening mammogram for malignant neoplasm of breast: Secondary | ICD-10-CM | POA: Diagnosis present

## 2021-11-14 ENCOUNTER — Other Ambulatory Visit: Payer: Self-pay | Admitting: Internal Medicine

## 2021-11-14 DIAGNOSIS — Z1231 Encounter for screening mammogram for malignant neoplasm of breast: Secondary | ICD-10-CM

## 2021-12-07 ENCOUNTER — Ambulatory Visit
Admission: RE | Admit: 2021-12-07 | Discharge: 2021-12-07 | Disposition: A | Discharge status: Home / Self Care | Payer: Medicare Other | Source: Ambulatory Visit | Attending: Internal Medicine | Admitting: Internal Medicine

## 2021-12-07 DIAGNOSIS — Z1231 Encounter for screening mammogram for malignant neoplasm of breast: Secondary | ICD-10-CM | POA: Insufficient documentation

## 2022-07-31 ENCOUNTER — Other Ambulatory Visit: Payer: Self-pay | Admitting: Internal Medicine

## 2022-07-31 DIAGNOSIS — Z85528 Personal history of other malignant neoplasm of kidney: Secondary | ICD-10-CM

## 2022-08-05 ENCOUNTER — Ambulatory Visit
Admission: RE | Admit: 2022-08-05 | Discharge: 2022-08-05 | Disposition: A | Discharge status: Home / Self Care | Payer: Medicare Other | Source: Ambulatory Visit | Attending: Internal Medicine | Admitting: Internal Medicine

## 2022-08-05 DIAGNOSIS — Z85528 Personal history of other malignant neoplasm of kidney: Secondary | ICD-10-CM | POA: Diagnosis present

## 2023-01-01 IMAGING — MG MM DIGITAL SCREENING BILAT W/ TOMO AND CAD
8 series · 8 of 24 positions shown · non-contrast
Comparison: Previous exam(s).

CLINICAL DATA: Screening.

EXAM:
DIGITAL SCREENING BILATERAL MAMMOGRAM WITH TOMOSYNTHESIS AND CAD
TECHNIQUE: Bilateral screening digital craniocaudal and mediolateral oblique
mammograms were obtained. Bilateral screening digital breast
tomosynthesis was performed. The images were evaluated with
computer-aided detection.

[L MLO synth-2D]
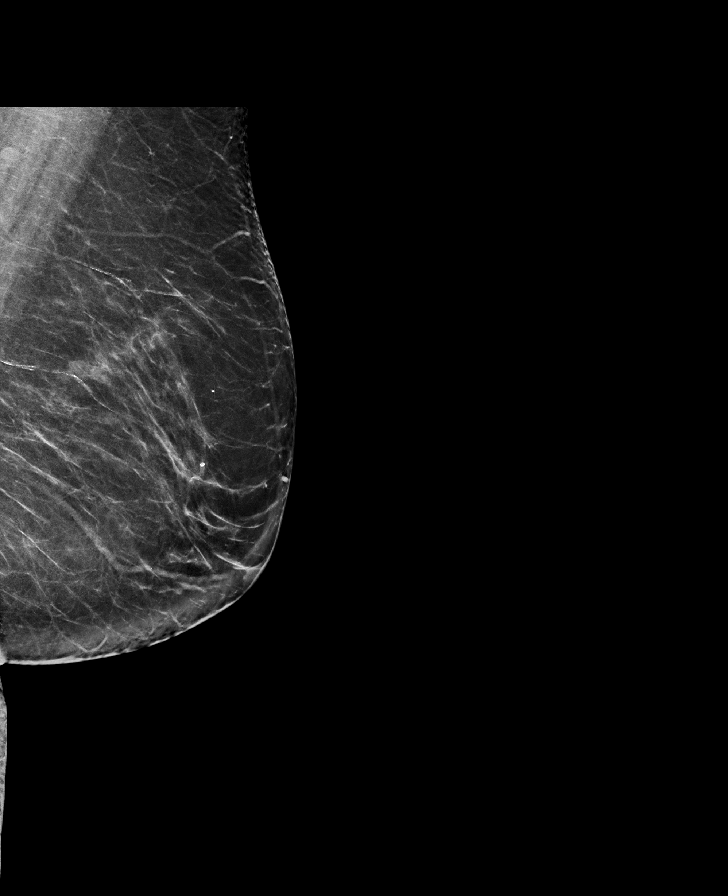

[R CC synth-2D]
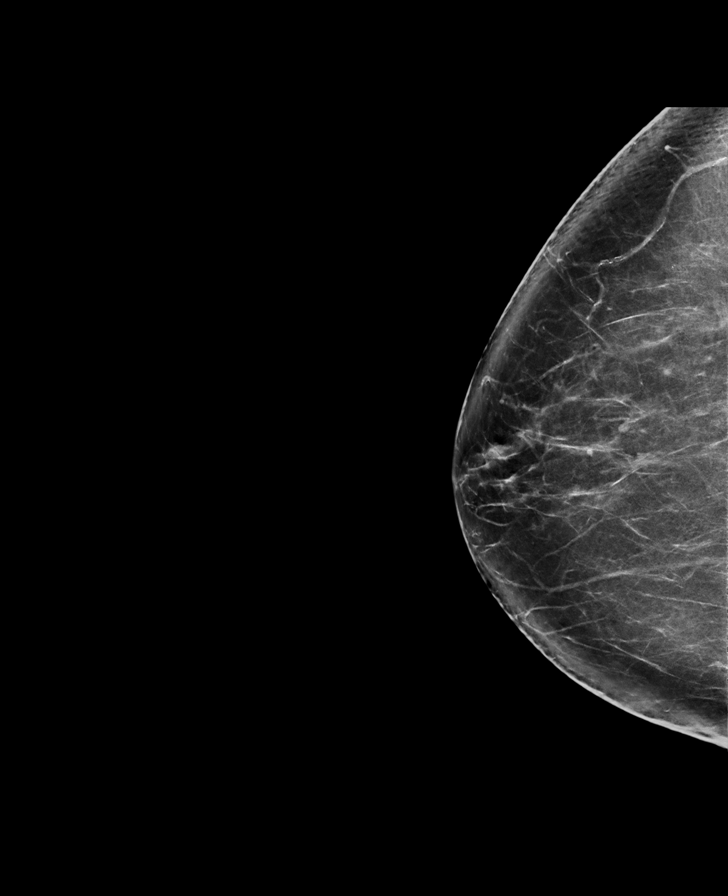

[R MLO synth-2D]
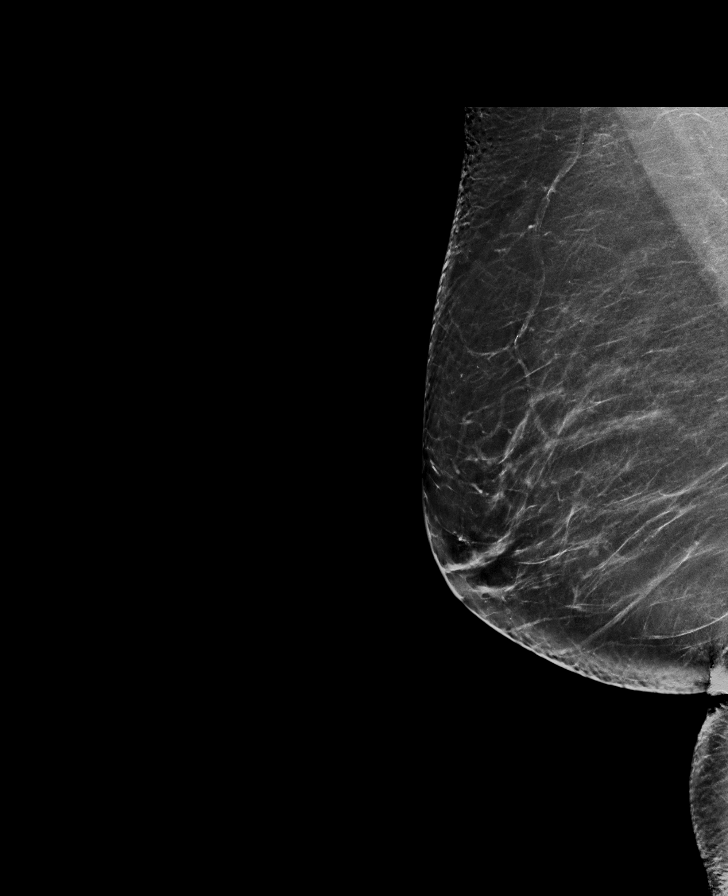

[L CC synth-2D]
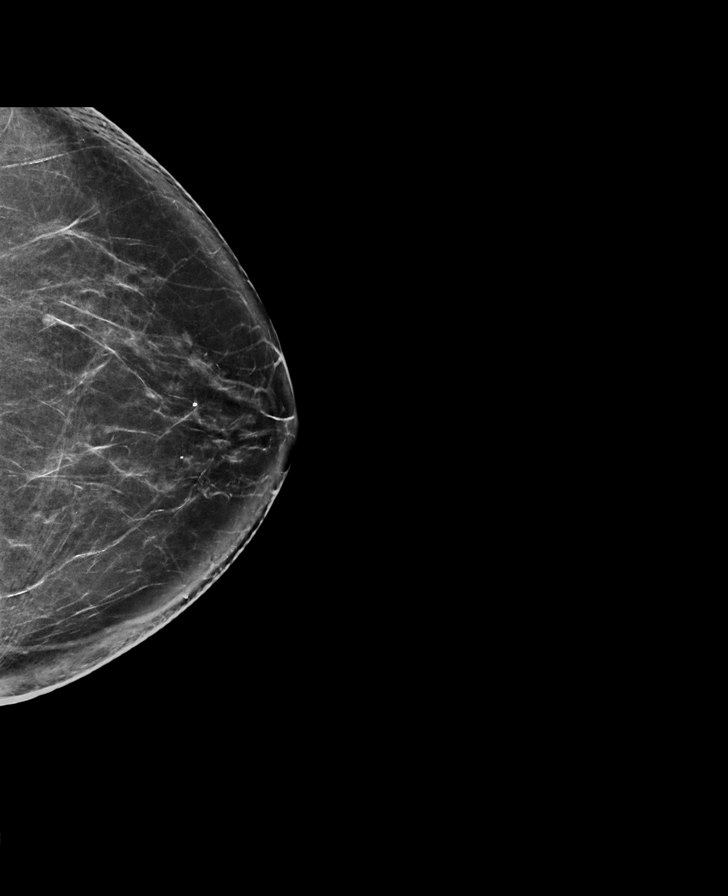

[R CC tomo · tomo slice 42/83.0]
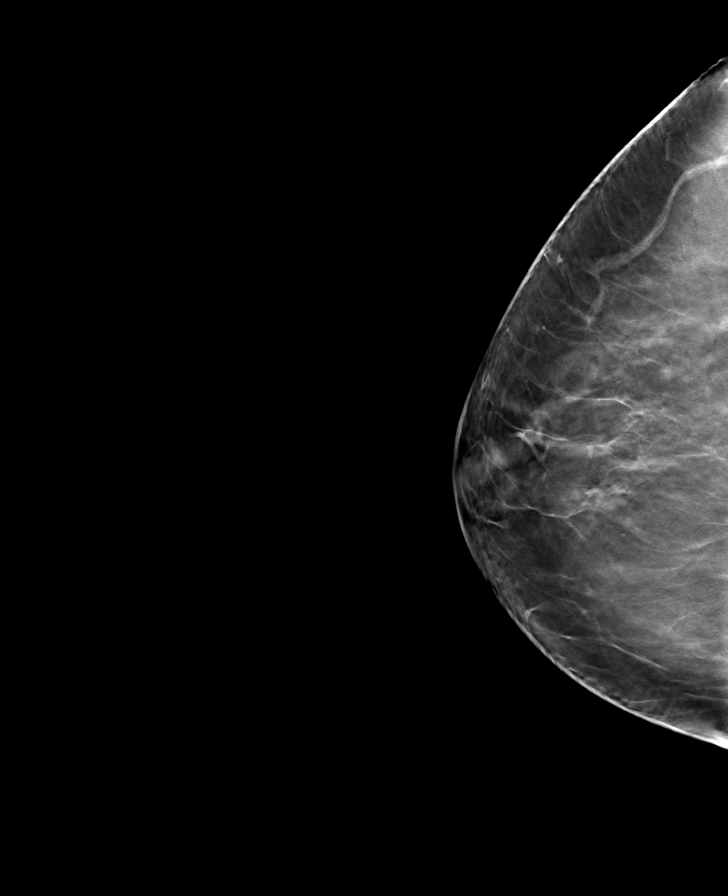

[L CC tomo · tomo slice 40/79.0]
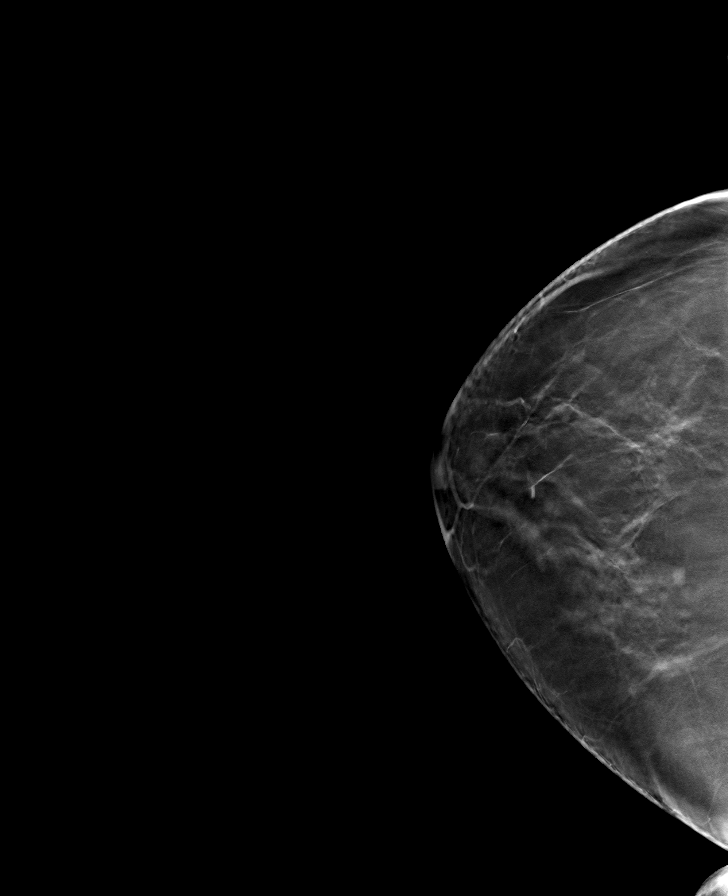

[L MLO tomo · tomo slice 40/79.0]
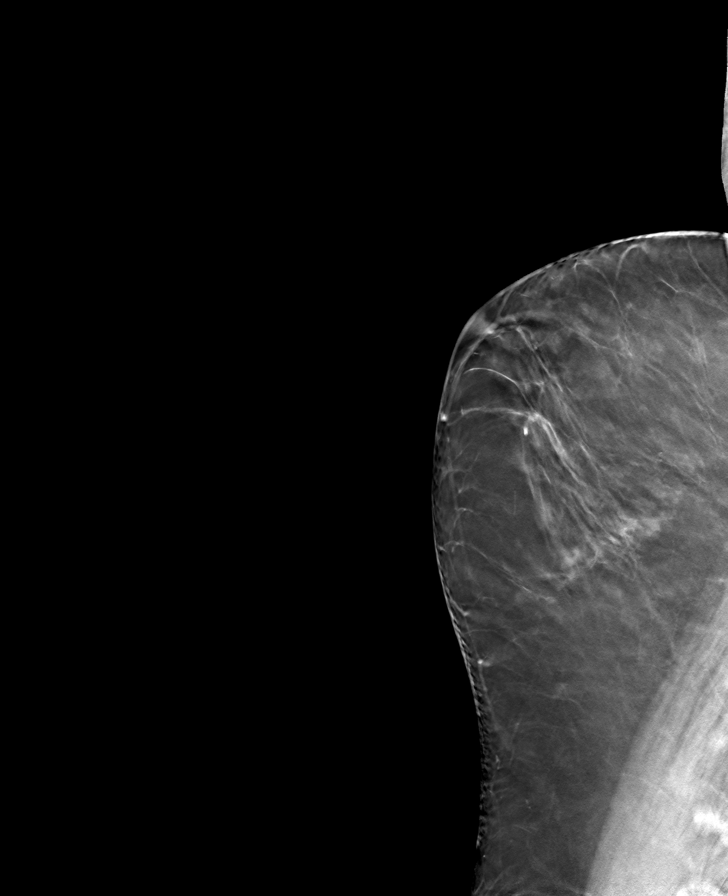

[R MLO tomo · tomo slice 43/86.0]
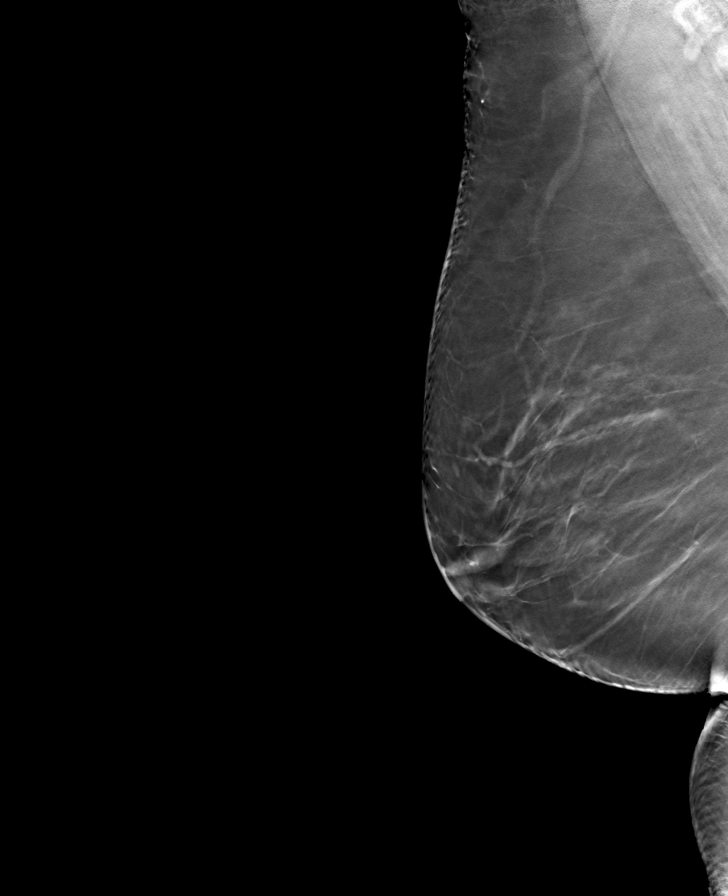

[8 of 24 positions shown; findings below may reference images not displayed]

ACR Breast Density Category b: There are scattered areas of
fibroglandular density.
FINDINGS: There are no findings suspicious for malignancy. The images were
evaluated with computer-aided detection.
IMPRESSION: No mammographic evidence of malignancy. A result letter of this
screening mammogram will be mailed directly to the patient.

RECOMMENDATION:
Screening mammogram in one year. (Code:[id number]-[id number])

BI-RADS CATEGORY  1: Negative.

## 2023-01-29 ENCOUNTER — Other Ambulatory Visit: Payer: Self-pay | Admitting: Internal Medicine

## 2023-01-29 DIAGNOSIS — Z1231 Encounter for screening mammogram for malignant neoplasm of breast: Secondary | ICD-10-CM

## 2023-02-07 ENCOUNTER — Ambulatory Visit
Admission: RE | Admit: 2023-02-07 | Discharge: 2023-02-07 | Disposition: A | Discharge status: Home / Self Care | Payer: Medicare Other | Source: Ambulatory Visit | Attending: Internal Medicine | Admitting: Internal Medicine

## 2023-02-07 DIAGNOSIS — Z1231 Encounter for screening mammogram for malignant neoplasm of breast: Secondary | ICD-10-CM

## 2023-02-17 ENCOUNTER — Encounter: Payer: Self-pay | Admitting: *Deleted

## 2023-02-17 ENCOUNTER — Ambulatory Visit: Payer: Medicare Other | Admitting: Certified Registered"

## 2023-02-17 ENCOUNTER — Ambulatory Visit
Admission: RE | Admit: 2023-02-17 | Discharge: 2023-02-17 | Disposition: A | Discharge status: Home / Self Care | Payer: Medicare Other | Attending: Gastroenterology | Admitting: Gastroenterology

## 2023-02-17 ENCOUNTER — Encounter
Admission: RE | Disposition: A | Discharge status: Home / Self Care | Payer: Self-pay | Source: Home / Self Care | Attending: Gastroenterology

## 2023-02-17 DIAGNOSIS — E119 Type 2 diabetes mellitus without complications: Secondary | ICD-10-CM | POA: Insufficient documentation

## 2023-02-17 DIAGNOSIS — E785 Hyperlipidemia, unspecified: Secondary | ICD-10-CM | POA: Insufficient documentation

## 2023-02-17 DIAGNOSIS — Z905 Acquired absence of kidney: Secondary | ICD-10-CM | POA: Diagnosis not present

## 2023-02-17 DIAGNOSIS — R1310 Dysphagia, unspecified: Secondary | ICD-10-CM | POA: Insufficient documentation

## 2023-02-17 DIAGNOSIS — K317 Polyp of stomach and duodenum: Secondary | ICD-10-CM | POA: Diagnosis not present

## 2023-02-17 DIAGNOSIS — Z1211 Encounter for screening for malignant neoplasm of colon: Secondary | ICD-10-CM | POA: Diagnosis present

## 2023-02-17 DIAGNOSIS — Z85528 Personal history of other malignant neoplasm of kidney: Secondary | ICD-10-CM | POA: Diagnosis not present

## 2023-02-17 DIAGNOSIS — Z7984 Long term (current) use of oral hypoglycemic drugs: Secondary | ICD-10-CM | POA: Diagnosis not present

## 2023-02-17 DIAGNOSIS — K5730 Diverticulosis of large intestine without perforation or abscess without bleeding: Secondary | ICD-10-CM | POA: Insufficient documentation

## 2023-02-17 DIAGNOSIS — D175 Benign lipomatous neoplasm of intra-abdominal organs: Secondary | ICD-10-CM | POA: Diagnosis not present

## 2023-02-17 DIAGNOSIS — R011 Cardiac murmur, unspecified: Secondary | ICD-10-CM | POA: Insufficient documentation

## 2023-02-17 DIAGNOSIS — K2100 Gastro-esophageal reflux disease with esophagitis, without bleeding: Secondary | ICD-10-CM | POA: Diagnosis not present

## 2023-02-17 HISTORY — PX: ESOPHAGOGASTRODUODENOSCOPY (EGD) WITH PROPOFOL: SHX5813

## 2023-02-17 HISTORY — PX: COLONOSCOPY WITH PROPOFOL: SHX5780

## 2023-02-17 HISTORY — DX: Cardiac murmur, unspecified: R01.1

## 2023-02-17 LAB — GLUCOSE, CAPILLARY: Glucose-Capillary: 151 mg/dL — ABNORMAL HIGH (ref 70–99)

## 2023-02-17 SURGERY — COLONOSCOPY WITH PROPOFOL
Anesthesia: General

## 2023-02-17 MED ORDER — PROPOFOL 10 MG/ML IV BOLUS
INTRAVENOUS | Status: AC
  Filled 2023-02-17: qty 40

## 2023-02-17 MED ORDER — PHENYLEPHRINE HCL (PRESSORS) 10 MG/ML IV SOLN
INTRAVENOUS | Status: DC | PRN
  Administered 2023-02-17: 80 ug via INTRAVENOUS

## 2023-02-17 MED ORDER — PHENYLEPHRINE 80 MCG/ML (10ML) SYRINGE FOR IV PUSH (FOR BLOOD PRESSURE SUPPORT)
PREFILLED_SYRINGE | INTRAVENOUS | Status: AC
  Filled 2023-02-17: qty 10

## 2023-02-17 MED ORDER — LIDOCAINE HCL (PF) 2 % IJ SOLN
INTRAMUSCULAR | Status: AC
  Filled 2023-02-17: qty 5

## 2023-02-17 MED ORDER — SODIUM CHLORIDE 0.9 % IV SOLN: INTRAVENOUS | Status: DC

## 2023-02-17 MED ORDER — PROPOFOL 10 MG/ML IV BOLUS
INTRAVENOUS | Status: DC | PRN
Start: 2023-02-17 — End: 2023-02-17
  Administered 2023-02-17: 170 ug/kg/min via INTRAVENOUS
  Administered 2023-02-17: 110 mg via INTRAVENOUS
  Administered 2023-02-17 (×4): 20 mg via INTRAVENOUS
  Administered 2023-02-17: 10 mg via INTRAVENOUS

## 2023-02-17 MED ORDER — LIDOCAINE HCL (CARDIAC) PF 100 MG/5ML IV SOSY
PREFILLED_SYRINGE | INTRAVENOUS | Status: DC | PRN
  Administered 2023-02-17: 100 mg via INTRAVENOUS

## 2023-02-17 NOTE — Anesthesia Preprocedure Evaluation (Signed)
Anesthesia Evaluation  Patient identified by MRN, date of birth, ID band Patient awake    Reviewed: Allergy & Precautions, H&P , NPO status , Patient's Chart, lab work & pertinent test results, reviewed documented beta blocker date and time   Airway Mallampati: II   Neck ROM: full    Dental  (+) Poor Dentition   Pulmonary neg pulmonary ROS   Pulmonary exam normal        Cardiovascular Exercise Tolerance: Poor Normal cardiovascular exam+ Valvular Problems/Murmurs  Rhythm:regular Rate:Normal     Neuro/Psych negative neurological ROS  negative psych ROS   GI/Hepatic negative GI ROS, Neg liver ROS,,,  Endo/Other  negative endocrine ROSdiabetes, Well Controlled    Renal/GU negative Renal ROS  negative genitourinary   Musculoskeletal   Abdominal   Peds  Hematology negative hematology ROS (+)   Anesthesia Other Findings Past Medical History: 2015: Cancer ([facility])     Comment:  kidney No date: Diabetes mellitus without complication ([facility]) No date: Dysphagia No date: Heart murmur No date: Rosacea Past Surgical History: No date: ABDOMINAL HYSTERECTOMY 11/11/2014: ESOPHAGOGASTRODUODENOSCOPY (EGD) WITH PROPOFOL; N/A     Comment:  Procedure: ESOPHAGOGASTRODUODENOSCOPY (EGD) WITH               PROPOFOL;  Surgeon: Christena Deem, MD;  Location:               [facility] ENDOSCOPY;  Service: Endoscopy;  Laterality: N/A; No date: excision abdominal tumor No date: LYMPHADENECTOMY No date: NEPHRECTOMY BMI    Body Mass Index: 29.08 kg/m     Reproductive/Obstetrics negative OB ROS                             Anesthesia Physical Anesthesia Plan  ASA: 3  Anesthesia Plan: General   Post-op Pain Management:    Induction:   PONV Risk Score and Plan:   Airway Management Planned:   Additional Equipment:   Intra-op Plan:   Post-operative Plan:   Informed Consent: I have reviewed the patients  History and Physical, chart, labs and discussed the procedure including the risks, benefits and alternatives for the proposed anesthesia with the patient or authorized representative who has indicated his/her understanding and acceptance.     Dental Advisory Given  Plan Discussed with: CRNA  Anesthesia Plan Comments:        Anesthesia Quick Evaluation

## 2023-02-17 NOTE — Interval H&P Note (Signed)
History and Physical Interval Note:  02/17/2023 8:24 AM  Sandra Gordon  has presented today for surgery, with the diagnosis of GERD,ESOPHAGEAL DYSPHAGIA,CCA SCREEN.  The various methods of treatment have been discussed with the patient and family. After consideration of risks, benefits and other options for treatment, the patient has consented to  Procedure(s): COLONOSCOPY WITH PROPOFOL (N/A) ESOPHAGOGASTRODUODENOSCOPY (EGD) WITH PROPOFOL (N/A) as a surgical intervention.  The patient's history has been reviewed, patient examined, no change in status, stable for surgery.  I have reviewed the patient's chart and labs.  Questions were answered to the patient's satisfaction.     Regis Bill  Ok to proceed with EGD/Colonoscopy

## 2023-02-17 NOTE — Op Note (Signed)
[facility] Gastroenterology Patient Name: Sandra Gordon Procedure Date: 02/17/2023 8:21 AM MRN: [id number] Account #: [id number] Date of Birth: 1950/12/13 Admit Type: Outpatient Age: 72 Room: [facility] ENDO ROOM 3 Gender: Female Note Status: Finalized Instrument Name: Upper Endoscope [id number] Procedure:             Upper GI endoscopy Indications:           Gastro-esophageal reflux disease Providers:             Eather Colas MD, MD Referring MD:          Gracelyn Nurse, MD (Referring MD) Medicines:             Monitored Anesthesia Care Complications:         No immediate complications. Procedure:             Pre-Anesthesia Assessment:                        - Prior to the procedure, a History and Physical was                         performed, and patient medications and allergies were                         reviewed. The patient is competent. The risks and                         benefits of the procedure and the sedation options and                         risks were discussed with the patient. All questions                         were answered and informed consent was obtained.                         Patient identification and proposed procedure were                         verified by the physician, the nurse, the                         anesthesiologist, the anesthetist and the technician                         in the endoscopy suite. Mental Status Examination:                         alert and oriented. Airway Examination: normal                         oropharyngeal airway and neck mobility. Respiratory                         Examination: clear to auscultation. CV Examination:                         normal. Prophylactic Antibiotics: The patient does not  require prophylactic antibiotics. Prior                         Anticoagulants: The patient has taken no anticoagulant                         or antiplatelet agents. ASA Grade  Assessment: III - A                         patient with severe systemic disease. After reviewing                         the risks and benefits, the patient was deemed in                         satisfactory condition to undergo the procedure. The                         anesthesia plan was to use monitored anesthesia care                         (MAC). Immediately prior to administration of                         medications, the patient was re-assessed for adequacy                         to receive sedatives. The heart rate, respiratory                         rate, oxygen saturations, blood pressure, adequacy of                         pulmonary ventilation, and response to care were                         monitored throughout the procedure. The physical                         status of the patient was re-assessed after the                         procedure.                        After obtaining informed consent, the endoscope was                         passed under direct vision. Throughout the procedure,                         the patient's blood pressure, pulse, and oxygen                         saturations were monitored continuously. The                         Endosonoscope was introduced through the mouth, and  advanced to the second part of duodenum. The upper GI                         endoscopy was accomplished without difficulty. The                         patient tolerated the procedure well. Findings:      The examined esophagus was normal.      A single 5 mm sessile fundic gland polyp with no stigmata of recent       bleeding was found in the gastric body.      The exam of the stomach was otherwise normal.      The examined duodenum was normal. Impression:            - Normal esophagus.                        - A single fundic gland polyp.                        - Normal examined duodenum.                        - No specimens  collected. Recommendation:        - Discharge patient to home.                        - Resume previous diet.                        - Continue present medications.                        - Perform a colonoscopy today.                        - Return to referring physician as previously                         scheduled. If persistent dysphagia, consider barium                         swallow. Procedure Code(s):     --- Professional ---                        [phone], Esophagogastroduodenoscopy, flexible,                         transoral; diagnostic, including collection of                         specimen(s) by brushing or washing, when performed                         (separate procedure) Diagnosis Code(s):     --- Professional ---                        K31.7, Polyp of stomach and duodenum                        K21.9, Gastro-esophageal reflux disease without  esophagitis CPT copyright 2022 American [employer] Association. All rights reserved. The codes documented in this report are preliminary and upon coder review may  be revised to meet current compliance requirements. Eather Colas MD, MD 02/17/2023 9:02:36 AM Number of Addenda: 0 Note Initiated On: 02/17/2023 8:21 AM Estimated Blood Loss:  Estimated blood loss: none.      [facility]

## 2023-02-17 NOTE — Group Note (Deleted)

## 2023-02-17 NOTE — Anesthesia Postprocedure Evaluation (Signed)
Anesthesia Post Note  Patient: ALLEENE FUNEZ  Procedure(s) Performed: COLONOSCOPY WITH PROPOFOL ESOPHAGOGASTRODUODENOSCOPY (EGD) WITH PROPOFOL  Patient location during evaluation: PACU Anesthesia Type: General Level of consciousness: awake and alert Pain management: pain level controlled Vital Signs Assessment: post-procedure vital signs reviewed and stable Respiratory status: spontaneous breathing, nonlabored ventilation, respiratory function stable and patient connected to nasal cannula oxygen Cardiovascular status: blood pressure returned to baseline and stable Postop Assessment: no apparent nausea or vomiting Anesthetic complications: no   No notable events documented.   Last Vitals:  Vitals:   02/17/23 0900 02/17/23 0910  BP: 121/61 136/70  Pulse: 86 86  Resp: 17 20  Temp: (!) 36.1 C   SpO2: 100% 100%    Last Pain:  Vitals:   02/17/23 0910  TempSrc:   PainSc: 0-No pain                 Yevette Edwards

## 2023-02-17 NOTE — H&P (Signed)
Outpatient short stay form Pre-procedure 02/17/2023  Regis Bill, MD  Primary Physician: Gracelyn Nurse, MD  Reason for visit:  GERD/Screening colonoscopy  History of present illness:    72 y/o lady with history of GERD, HLD, and kidney cancer here for EGD for GERD and screening colonoscopy. No blood thinners. No family history of GI malignancies. History of hysterectomy and partial nephrectomy.    Current Facility-Administered Medications:    0.9 %  sodium chloride infusion, , Intravenous, Continuous, Deric Bocock, Rossie Muskrat, MD, Last Rate: 20 mL/hr at 02/17/23 0819, New Bag at 02/17/23 0819  Medications Prior to Admission  Medication Sig Dispense Refill Last Dose   atorvastatin (LIPITOR) 10 MG tablet Take 10 mg by mouth daily.   02/15/2023   celecoxib (CELEBREX) 100 MG capsule Take 100 mg by mouth 2 (two) times daily.   02/15/2023   glipiZIDE (GLUCOTROL) 5 MG tablet Take 5 mg by mouth daily before breakfast.   02/15/2023   pantoprazole (PROTONIX) 20 MG tablet Take 20 mg by mouth daily.   02/15/2023   tirzepatide (MOUNJARO) 12.5 MG/0.5ML Pen Inject 12.5 mg into the skin once a week.   02/10/2023   zolpidem (AMBIEN) 5 MG tablet Take 5 mg by mouth at bedtime as needed for sleep.   02/15/2023   calcium carbonate (OS-CAL) 600 MG TABS tablet Take 1 tablet by mouth daily.      citalopram (CELEXA) 20 MG tablet Take 1 tablet by mouth daily.   02/15/2023   doxycycline (VIBRAMYCIN) 100 MG capsule Take 1 capsule by mouth daily. (Patient not taking: Reported on 02/14/2023)   Not Taking   meloxicam (MOBIC) 15 MG tablet Take 1 tablet by mouth daily. (Patient not taking: Reported on 02/14/2023)   Not Taking   Omeprazole 20 MG TBEC Take 1 tablet by mouth daily. (Patient not taking: Reported on 02/14/2023)   Not Taking   spironolactone (ALDACTONE) 50 MG tablet Take 1 tablet by mouth 2 (two) times daily.   02/15/2023   temazepam (RESTORIL) 15 MG capsule Take 1 capsule by mouth at bedtime. (Patient not taking: Reported on  02/14/2023)   Not Taking   tolterodine (DETROL) 2 MG tablet Take 1 tablet by mouth 2 (two) times daily. (Patient not taking: Reported on 02/14/2023)   Not Taking     Allergies  Allergen Reactions   Codeine Palpitations   Sulfa Antibiotics Rash     Past Medical History:  Diagnosis Date   Cancer (HCC) 2015   kidney   Diabetes mellitus without complication (HCC)    Dysphagia    Heart murmur    Rosacea     Review of systems:  Otherwise negative.    Physical Exam  Gen: Alert, oriented. Appears stated age.  HEENT: PERRLA. Lungs: No respiratory distress CV: RRR Abd: soft, benign, no masses Ext: No edema    Planned procedures: Proceed with EGD/colonoscopy. The patient understands the nature of the planned procedure, indications, risks, alternatives and potential complications including but not limited to bleeding, infection, perforation, damage to internal organs and possible oversedation/side effects from anesthesia. The patient agrees and gives consent to proceed.  Please refer to procedure notes for findings, recommendations and patient disposition/instructions.     Regis Bill, MD [facility] Gastroenterology

## 2023-02-17 NOTE — Op Note (Signed)
[facility] Gastroenterology Patient Name: Sandra Gordon Procedure Date: 02/17/2023 8:19 AM MRN: [id number] Account #: [id number] Date of Birth: 01-Nov-1950 Admit Type: Outpatient Age: 72 Room: [facility] ENDO ROOM 3 Gender: Female Note Status: Finalized Instrument Name: Prentice Docker [id number] Procedure:             Colonoscopy Indications:           Screening for colorectal malignant neoplasm Providers:             Eather Colas MD, MD Referring MD:          Gracelyn Nurse, MD (Referring MD) Medicines:             Monitored Anesthesia Care Complications:         No immediate complications. Procedure:             Pre-Anesthesia Assessment:                        - Prior to the procedure, a History and Physical was                         performed, and patient medications and allergies were                         reviewed. The patient is competent. The risks and                         benefits of the procedure and the sedation options and                         risks were discussed with the patient. All questions                         were answered and informed consent was obtained.                         Patient identification and proposed procedure were                         verified by the physician, the nurse, the                         anesthesiologist, the anesthetist and the technician                         in the endoscopy suite. Mental Status Examination:                         alert and oriented. Airway Examination: normal                         oropharyngeal airway and neck mobility. Respiratory                         Examination: clear to auscultation. CV Examination:                         normal. Prophylactic Antibiotics: The patient does not  require prophylactic antibiotics. Prior                         Anticoagulants: The patient has taken no anticoagulant                         or antiplatelet agents. ASA Grade  Assessment: III - A                         patient with severe systemic disease. After reviewing                         the risks and benefits, the patient was deemed in                         satisfactory condition to undergo the procedure. The                         anesthesia plan was to use monitored anesthesia care                         (MAC). Immediately prior to administration of                         medications, the patient was re-assessed for adequacy                         to receive sedatives. The heart rate, respiratory                         rate, oxygen saturations, blood pressure, adequacy of                         pulmonary ventilation, and response to care were                         monitored throughout the procedure. The physical                         status of the patient was re-assessed after the                         procedure.                        After obtaining informed consent, the colonoscope was                         passed under direct vision. Throughout the procedure,                         the patient's blood pressure, pulse, and oxygen                         saturations were monitored continuously. The                         Colonoscope was introduced through the anus and  advanced to the the cecum, identified by appendiceal                         orifice and ileocecal valve. The colonoscopy was                         somewhat difficult due to restricted mobility of the                         colon. The patient tolerated the procedure well. The                         quality of the bowel preparation was good. The                         ileocecal valve, appendiceal orifice, and rectum were                         photographed. Findings:      The perianal and digital rectal examinations were normal.      There was a small lipoma, 14 mm in diameter, in the cecum.      A single small-mouthed diverticulum was  found in the ascending colon.      Multiple small-mouthed diverticula were found in the sigmoid colon.      The exam was otherwise without abnormality on direct and retroflexion       views. Impression:            - Small lipoma in the cecum.                        - Diverticulosis in the ascending colon.                        - Diverticulosis in the sigmoid colon.                        - The examination was otherwise normal on direct and                         retroflexion views.                        - No specimens collected. Recommendation:        - Discharge patient to home.                        - Resume previous diet.                        - Continue present medications.                        - Repeat colonoscopy is not recommended due to current                         age (60 years or older) for screening purposes.                        - Return to referring physician as previously  scheduled. Procedure Code(s):     --- Professional ---                        H4742, Colorectal cancer screening; colonoscopy on                         individual not meeting criteria for high risk Diagnosis Code(s):     --- Professional ---                        Z12.11, Encounter for screening for malignant neoplasm                         of colon                        D17.5, Benign lipomatous neoplasm of intra-abdominal                         organs                        K57.30, Diverticulosis of large intestine without                         perforation or abscess without bleeding CPT copyright 2022 American Medical Association. All rights reserved. The codes documented in this report are preliminary and upon coder review may  be revised to meet current compliance requirements. Eather Colas MD, MD 02/17/2023 9:07:39 AM Number of Addenda: 0 Note Initiated On: 02/17/2023 8:19 AM Scope Withdrawal Time: 0 hours 7 minutes 35 seconds  Total Procedure Duration:  0 hours 17 minutes 9 seconds  Estimated Blood Loss:  Estimated blood loss: none.      [facility]

## 2023-02-17 NOTE — Transfer of Care (Signed)
Immediate Anesthesia Transfer of Care Note  Patient: Sandra Gordon  Procedure(s) Performed: COLONOSCOPY WITH PROPOFOL ESOPHAGOGASTRODUODENOSCOPY (EGD) WITH PROPOFOL  Patient Location: Endoscopy Unit  Anesthesia Type:General  Level of Consciousness: awake, alert , and oriented  Airway & Oxygen Therapy: Patient Spontanous Breathing  Post-op Assessment: Report given to RN and Post -op Vital signs reviewed and stable  Post vital signs: Reviewed and stable  Last Vitals:  Vitals Value Taken Time  BP 121/61 02/17/23 0900  Temp 35.8 0900  Pulse 86 02/17/23 0900  Resp 17 02/17/23 0900  SpO2 100 % 02/17/23 0900    Last Pain:  Vitals:   02/17/23 0900  TempSrc:   PainSc: 0-No pain         Complications: No notable events documented.

## 2023-02-18 ENCOUNTER — Encounter: Payer: Self-pay | Admitting: Gastroenterology

## 2024-05-13 ENCOUNTER — Other Ambulatory Visit: Payer: Self-pay | Admitting: Internal Medicine

## 2024-05-13 DIAGNOSIS — Z1231 Encounter for screening mammogram for malignant neoplasm of breast: Secondary | ICD-10-CM

## 2024-06-08 ENCOUNTER — Ambulatory Visit
Admission: RE | Admit: 2024-06-08 | Discharge: 2024-06-08 | Disposition: A | Discharge status: Home / Self Care | Source: Ambulatory Visit | Attending: Internal Medicine | Admitting: Internal Medicine

## 2024-06-08 DIAGNOSIS — Z1231 Encounter for screening mammogram for malignant neoplasm of breast: Secondary | ICD-10-CM | POA: Diagnosis present
